# Patient Record
Sex: Male | Born: 1965 | Race: White | Hispanic: No | Marital: Married | Smoking: Never smoker
Health system: Southern US, Community
[De-identification: ages and names within clinical notes are randomized; demographics above are authoritative.]

## PROBLEM LIST (undated history)

## (undated) DIAGNOSIS — Z789 Other specified health status: Secondary | ICD-10-CM

## (undated) DIAGNOSIS — N200 Calculus of kidney: Secondary | ICD-10-CM

---

## 2000-11-28 ENCOUNTER — Ambulatory Visit (HOSPITAL_BASED_OUTPATIENT_CLINIC_OR_DEPARTMENT_OTHER): Admission: RE | Admit: 2000-11-28 | Discharge: 2000-11-28 | Payer: Self-pay | Admitting: Gastroenterology

## 2005-02-25 HISTORY — PX: KNEE ARTHROSCOPY: SUR90

## 2009-02-25 HISTORY — PX: UMBILICAL HERNIA REPAIR: SHX196

## 2010-12-27 DIAGNOSIS — N200 Calculus of kidney: Secondary | ICD-10-CM

## 2010-12-27 HISTORY — DX: Calculus of kidney: N20.0

## 2011-01-23 ENCOUNTER — Other Ambulatory Visit: Payer: Self-pay | Admitting: Family Medicine

## 2011-01-23 ENCOUNTER — Ambulatory Visit
Admission: RE | Admit: 2011-01-23 | Discharge: 2011-01-23 | Disposition: A | Discharge status: Home / Self Care | Payer: 59 | Source: Ambulatory Visit | Attending: Family Medicine | Admitting: Family Medicine

## 2011-01-23 DIAGNOSIS — R310 Gross hematuria: Secondary | ICD-10-CM

## 2011-02-15 ENCOUNTER — Other Ambulatory Visit: Payer: Self-pay | Admitting: Urology

## 2011-02-20 ENCOUNTER — Encounter (HOSPITAL_COMMUNITY): Payer: Self-pay | Admitting: Pharmacy Technician

## 2011-02-20 ENCOUNTER — Encounter (HOSPITAL_COMMUNITY): Payer: Self-pay | Admitting: *Deleted

## 2011-02-23 NOTE — H&P (Signed)
History of Present Illness  [name] presents today as a new patient with a recently diagnosed ureteral stone and several days of gross hematuria and nonspecific back pain.  [name] is 45 years of age.  We do care for his father.  [name] initially noticed asymptomatic gross hematuria several days ago.  He subsequently developed some right-sided and lower back discomfort.  Recent CT showed what appeared to be a 4 x 6 x 7 mm proximal right ureteral stone.  Some very tiny bilateral nonobstructing stones. He has intermittently continued to have some discomfort.  I have reviewed his CT and concur with those findings.     Past Medical History Problems  1. History of  No Medical Problems  Surgical History Problems  1. History of  Knee Surgery 2. History of  Umbilical Hernia Repair  Current Meds 1. Oxycodone-Acetaminophen 5-325 MG Oral Tablet; TAKE 1 TABLET Every  4 hours PRN pain;  Therapy: 30Nov2012 to (Last Rx:30Nov2012)  Allergies Medication  1. No Known Drug Allergies  Family History Problems  1. Maternal history of  Breast Cancer V16.3 2. Maternal history of  Death In The Family Mother 63yrs, breast ca Denied  3. Family history of  Family Health Status Number Of Children  Social History Problems  1. Caffeine Use 2 qd 2. Marital History - Currently Married 3. Never A Smoker 4. 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 History of  Alcohol Use 6. History of  Tobacco Use  Review of Systems Genitourinary, constitutional, skin, eye, otolaryngeal, hematologic/lymphatic, cardiovascular, pulmonary, endocrine, musculoskeletal, gastrointestinal, neurological and psychiatric system(s) were reviewed and pertinent findings if present are noted.  Gastrointestinal: flank pain, but no nausea and no vomiting.    Vitals Vital Signs [Data Includes: Last 1 Day]  20Dec2012 01:19PM  Height: 5 ft 11 in Blood Pressure: 109 / 71 Temperature: 97.6 F Heart Rate: 62  Physical  Exam Constitutional: Well nourished and well developed . No acute distress.  ENT:. The ears and nose are normal in appearance.  Neck: The appearance of the neck is normal and no neck mass is present.  Pulmonary: No respiratory distress and normal respiratory rhythm and effort.  Cardiovascular: Heart rate and rhythm are normal . No peripheral edema.  Abdomen: The abdomen is soft and nontender. No masses are palpated. No CVA tenderness. No hernias are palpable. No hepatosplenomegaly noted.  Genitourinary: Examination of the penis demonstrates no discharge, no masses, no lesions and a normal meatus. The scrotum is without lesions. The right epididymis is palpably normal and non-tender. The left epididymis is palpably normal and non-tender. The right testis is non-tender and without masses. The left testis is non-tender and without masses.  Skin: Normal skin turgor, no visible rash and no visible skin lesions.  Neuro/Psych:. Mood and affect are appropriate.    Results/Data Urine [Data Includes: Last 1 Day]   20Dec2012  COLOR YELLOW   APPEARANCE CLEAR   SPECIFIC GRAVITY <1.005   pH 7.0   GLUCOSE NEG mg/dL  BILIRUBIN NEG   KETONE NEG mg/dL  BLOOD MOD   PROTEIN NEG mg/dL  UROBILINOGEN 0.2 mg/dL  NITRITE NEG   LEUKOCYTE ESTERASE NEG   SQUAMOUS EPITHELIAL/HPF RARE   WBC 0-3 WBC/hpf  RBC 0-3 RBC/hpf  BACTERIA RARE   CRYSTALS NONE SEEN   CASTS NONE SEEN    Assessment Assessed  1. Nephrolithiasis 592.0 2. Ureteral Stone 592.1  Plan Health Maintenance (V70.0)  1. UA With REFLEX  Done: 20Dec2012 12:59PM Ureteral Stone (592.1)  2. Follow-up Schedule Surgery Office  Follow-up  Requested for: 20Dec2012  Discussion/Summary          At this point [place] still has a 6-7 mm proximal right ureteral stone. There has really been no clinical progression after 3 weeks. At this point I think his chances of spontaneously passing this are certainly decreased and it would be appropriate at this  point to go ahead and get him on the schedule for treatment. Given the current location of the stone and it size, lithotripsy would make the most sense. I explained that procedure to him, what is involved, typical success rates, risk factors, etc. We will try to get him on the schedule for sometime in the next few weeks to have this. Obviously, if he does get lucky and passes the stone, we would be delighted to cancel. If he gets any significant problems clinically before then with substantial increases of pain or other problems, then we will try to expedite his care if possible.       Signatures Electronically signed by : Wesley Carey, M.D.; Feb 14 2011  3:43PM

## 2011-02-25 ENCOUNTER — Ambulatory Visit (HOSPITAL_COMMUNITY)
Admission: RE | Admit: 2011-02-25 | Discharge: 2011-02-25 | Disposition: A | Discharge status: Home / Self Care | Payer: 59 | Source: Ambulatory Visit | Attending: Urology | Admitting: Urology

## 2011-02-25 ENCOUNTER — Ambulatory Visit (HOSPITAL_COMMUNITY): Payer: 59

## 2011-02-25 ENCOUNTER — Encounter (HOSPITAL_COMMUNITY): Payer: Self-pay | Admitting: *Deleted

## 2011-02-25 ENCOUNTER — Encounter (HOSPITAL_COMMUNITY)
Admission: RE | Disposition: A | Discharge status: Home / Self Care | Payer: Self-pay | Source: Ambulatory Visit | Attending: Urology

## 2011-02-25 DIAGNOSIS — N201 Calculus of ureter: Secondary | ICD-10-CM | POA: Insufficient documentation

## 2011-02-25 HISTORY — DX: Calculus of kidney: N20.0

## 2011-02-25 HISTORY — DX: Other specified health status: Z78.9

## 2011-02-25 LAB — BASIC METABOLIC PANEL
BUN: 15 mg/dL (ref 6–23)
CO2: 31 mEq/L (ref 19–32)
Calcium: 10 mg/dL (ref 8.4–10.5)
Chloride: 103 mEq/L (ref 96–112)
Creatinine, Ser: 0.83 mg/dL (ref 0.50–1.35)
GFR calc Af Amer: >90 mL/min (ref >90)
GFR calc non Af Amer: >90 mL/min (ref >90)
Glucose, Bld: 92 mg/dL (ref 70–99)
Potassium: 4.2 mEq/L (ref 3.5–5.1)
Sodium: 141 mEq/L (ref 135–145)

## 2011-02-25 SURGERY — LITHOTRIPSY, ESWL
Anesthesia: LOCAL | Laterality: Right

## 2011-02-25 MED ORDER — DIAZEPAM 5 MG PO TABS
ORAL_TABLET | ORAL | Status: AC
  Administered 2011-02-25: 10 mg via ORAL
  Filled 2011-02-25: qty 2

## 2011-02-25 MED ORDER — CIPROFLOXACIN HCL 500 MG PO TABS
500.0000 mg | ORAL_TABLET | ORAL | Status: AC
  Administered 2011-02-25: 500 mg via ORAL

## 2011-02-25 MED ORDER — DIPHENHYDRAMINE HCL 25 MG PO CAPS
ORAL_CAPSULE | ORAL | Status: AC
  Administered 2011-02-25: 25 mg via ORAL
  Filled 2011-02-25: qty 1

## 2011-02-25 MED ORDER — DIAZEPAM 5 MG PO TABS
10.0000 mg | ORAL_TABLET | ORAL | Status: AC
  Administered 2011-02-25: 10 mg via ORAL

## 2011-02-25 MED ORDER — DEXTROSE-NACL 5-0.45 % IV SOLN: INTRAVENOUS | Status: DC

## 2011-02-25 MED ORDER — DIPHENHYDRAMINE HCL 25 MG PO CAPS
25.0000 mg | ORAL_CAPSULE | ORAL | Status: AC
  Administered 2011-02-25: 25 mg via ORAL

## 2011-02-25 MED ORDER — CIPROFLOXACIN HCL 500 MG PO TABS
ORAL_TABLET | ORAL | Status: AC
  Administered 2011-02-25: 500 mg via ORAL
  Filled 2011-02-25: qty 1

## 2011-02-25 NOTE — Progress Notes (Signed)
Pt took laxative as ordered. Denies aspirin use past three days.

## 2011-02-25 NOTE — Op Note (Signed)
See [facility] OP note scanned into chart. 

## 2011-02-25 NOTE — Interval H&P Note (Signed)
History and Physical Interval Note:  02/25/2011 12:00 PM  Wesley Carey  has presented today for surgery, with the diagnosis of Right Ureteral Stone  The various methods of treatment have been discussed with the patient and family. After consideration of risks, benefits and other options for treatment, the patient has consented to  Procedure(s): EXTRACORPOREAL SHOCK WAVE LITHOTRIPSY (ESWL) as a surgical intervention .  The patients' history has been reviewed, patient examined, no change in status, stable for surgery.  I have reviewed the patients' chart and labs.  Questions were answered to the patient's satisfaction.     [name] S

## 2011-02-26 HISTORY — PX: LITHOTRIPSY: SUR834

## 2012-07-07 IMAGING — CT CT ABD-PELV W/O CM
2 of 4 series · 13 of 36 positions shown, 19 images · non-contrast
Comparison: None.

CLINICAL DATA: Painless gross hematuria for 5 days

CT ABDOMEN AND PELVIS WITHOUT CONTRAST
TECHNIQUE: Multidetector CT imaging of the abdomen and pelvis was
performed following the standard protocol without intravenous
contrast.

[Series 601: coronal body · coronal · 0.97mm/px · 1 of 118 slices shown, 2 images]
[im 40/118  soft-tissue]
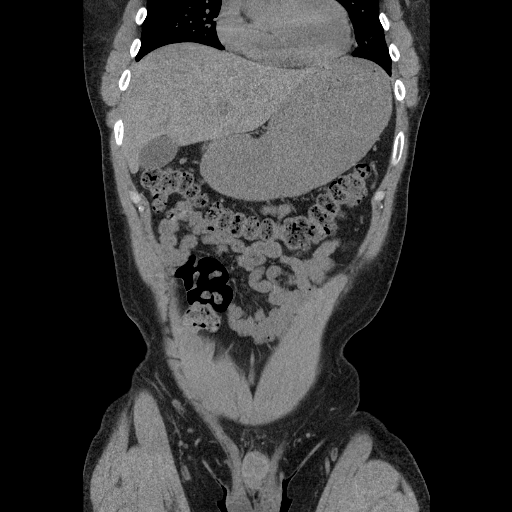
[im 40/118  bone]
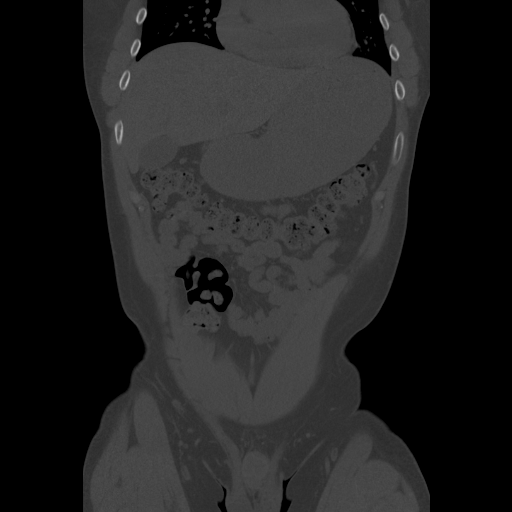

[Series 602: sagittal body · sagittal · 0.97mm/px · 12 of 141 slices shown, 17 images]
[im 9/141  soft-tissue]
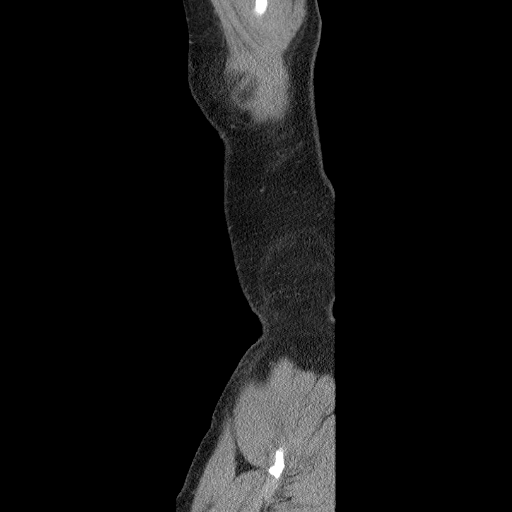
[im 9/141  lung]
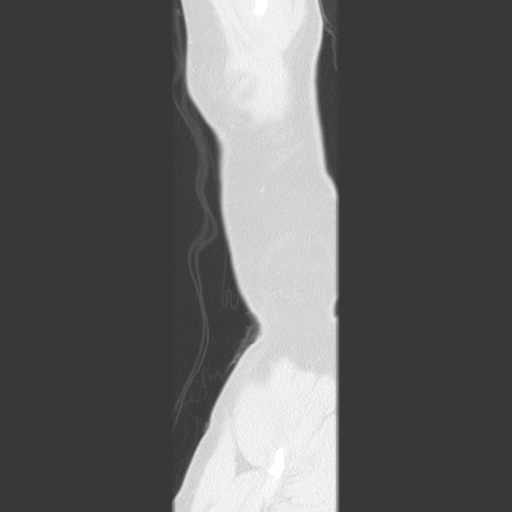
[im 9/141  bone]
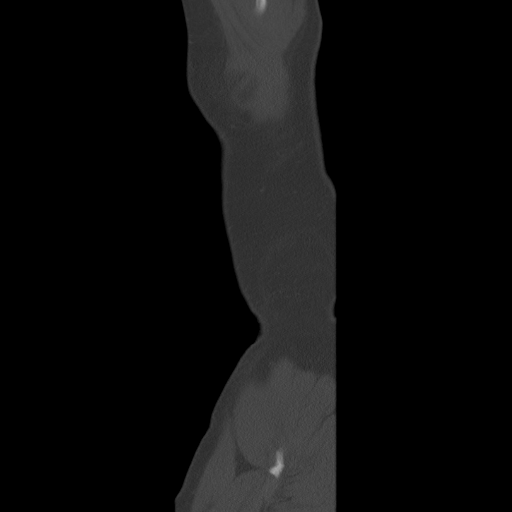
[im 18/141  lung]
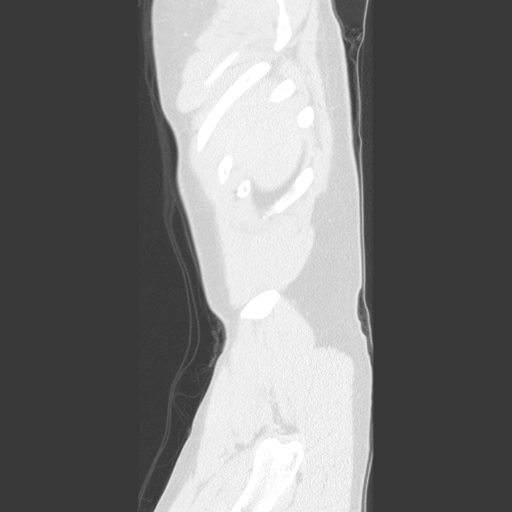
[im 27/141  soft-tissue]
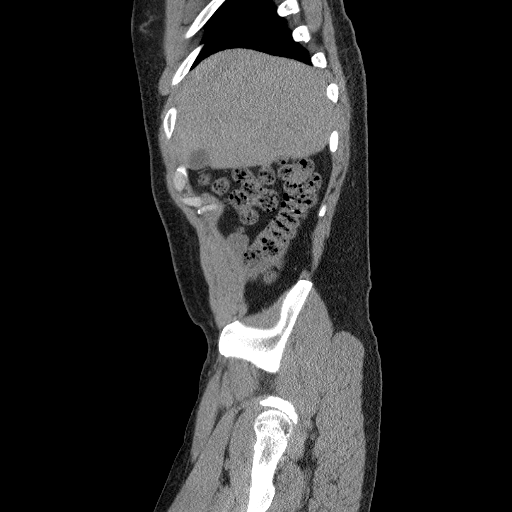
[im 27/141  lung]
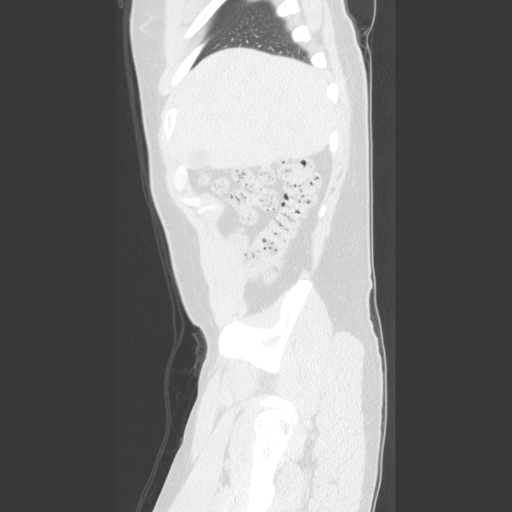
[im 36/141  soft-tissue]
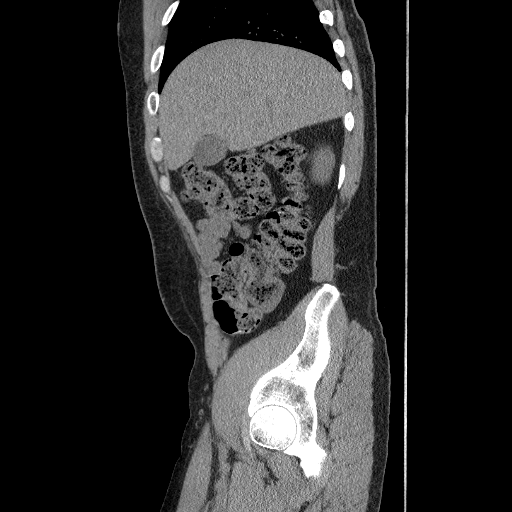
[im 36/141  lung]
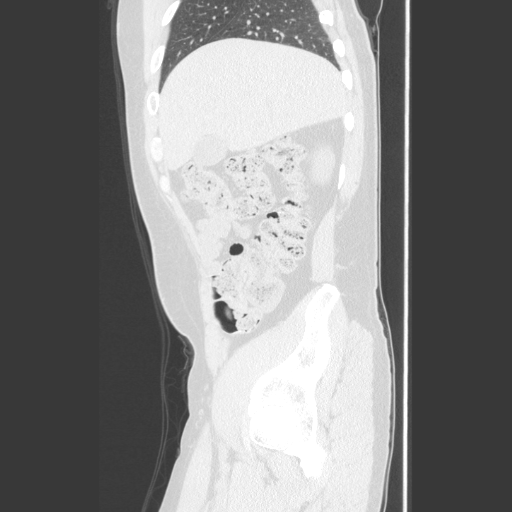
[im 44/141  soft-tissue]
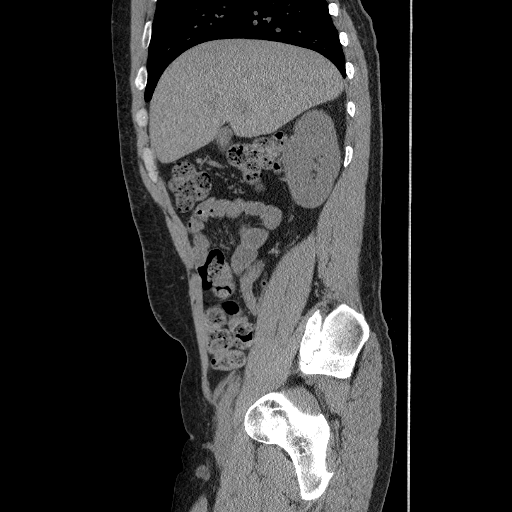
[im 62/141  soft-tissue]
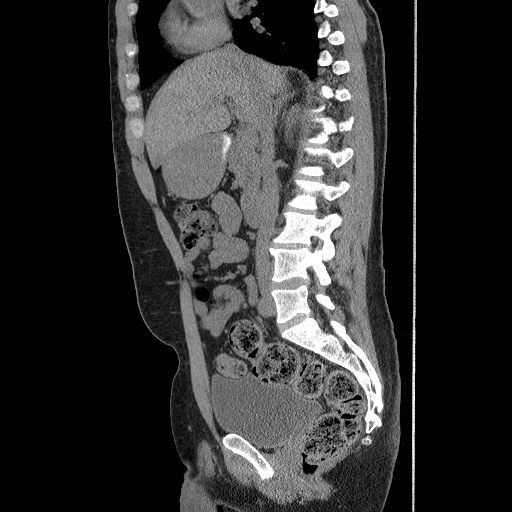
[im 71/141  soft-tissue]
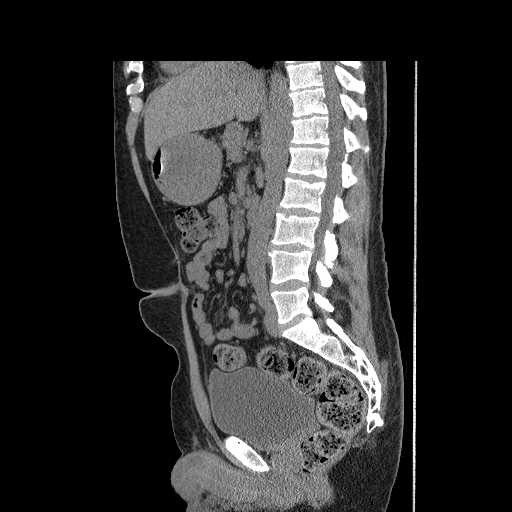
[im 79/141  soft-tissue]
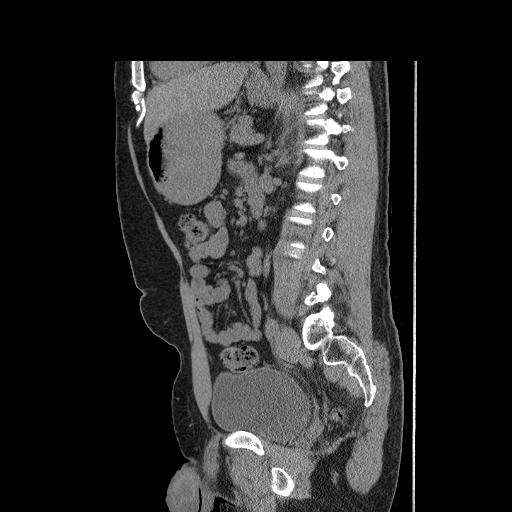
[im 97/141  soft-tissue]
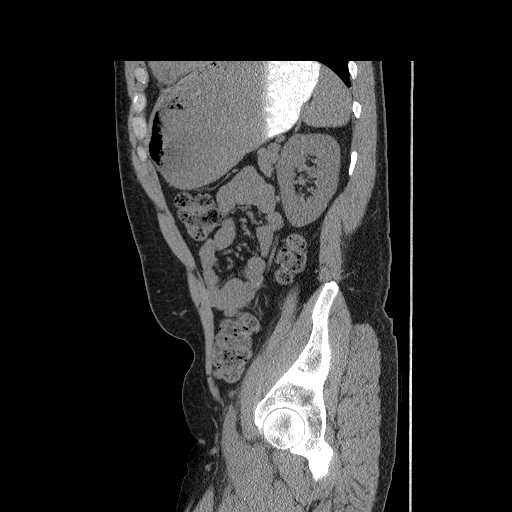
[im 106/141  soft-tissue]
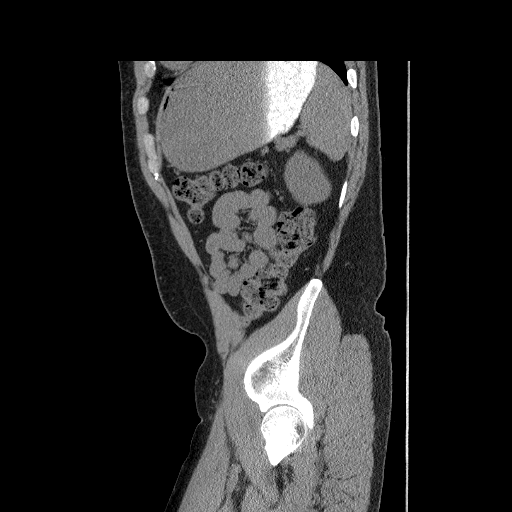
[im 106/141  bone]
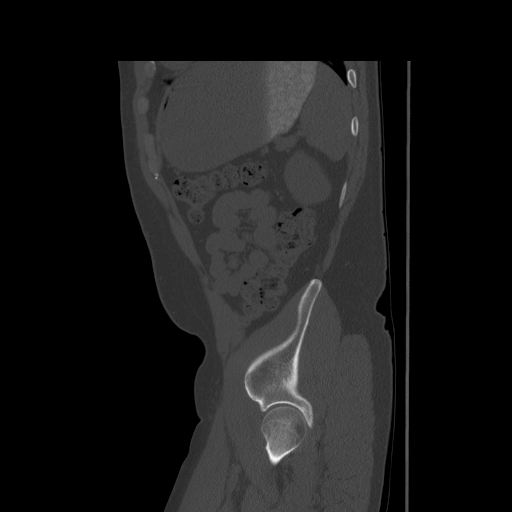
[im 114/141  soft-tissue]
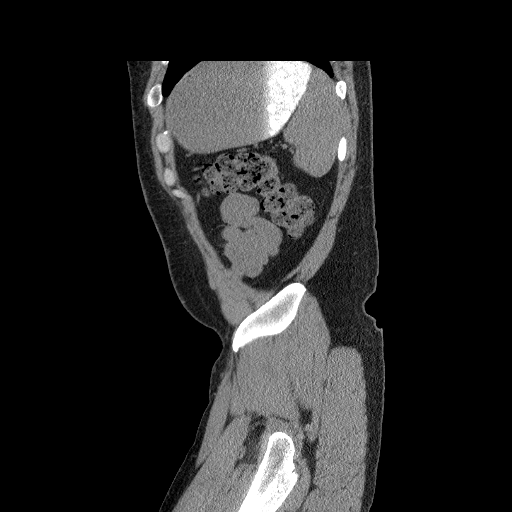
[im 132/141  soft-tissue]
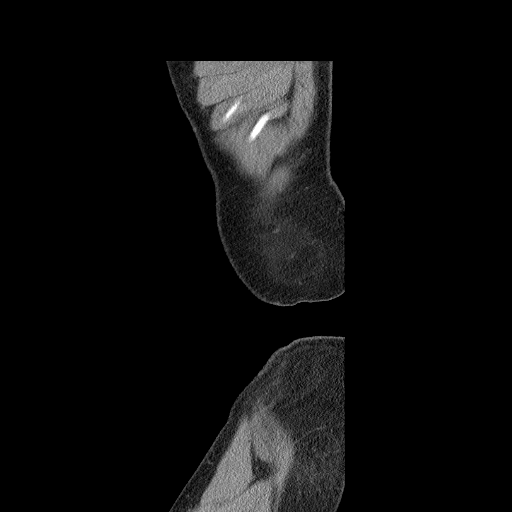

[13 of 36 positions shown; findings below may reference images not displayed]

FINDINGS: The lung bases are clear.  The liver is unremarkable in
the unenhanced state.  No calcified gallstones are seen.  The
pancreas is normal in size and the pancreatic duct is not dilated.
The adrenal glands and spleen are unremarkable.  The stomach is
moderately well distended with fluid and contrast and no
abnormality is seen.  Small nonobstructing bilateral renal calculi
are present.  However there is a calculus within the proximal right
ureter creating mild right hydronephrosis measuring 6 x 4 x 7 mm.
The abdominal aorta is normal in caliber.  No adenopathy is seen.

The distal ureters are normal in caliber and no distal ureteral
calculi are seen.  The urinary bladder is unremarkable.  The
prostate is within normal limits in size for age.  No pelvic mass
or fluid is seen.  There is feces throughout the colon.  The
terminal ileum and appendix are unremarkable.  No bony abnormality
is seen.
IMPRESSION: 1.  Mild hydronephrosis on the right caused by a 6 x 4 x 7 mm
proximal right ureteral calculus just below the right U P junction.
2.  Smaller nonobstructing renal calculi bilaterally.

## 2015-04-13 ENCOUNTER — Encounter: Payer: Self-pay | Admitting: Internal Medicine

## 2015-05-12 ENCOUNTER — Ambulatory Visit (AMBULATORY_SURGERY_CENTER): Payer: Self-pay | Admitting: *Deleted

## 2015-05-12 VITALS — Ht 66.0 in | Wt 191.6 lb

## 2015-05-12 DIAGNOSIS — Z1211 Encounter for screening for malignant neoplasm of colon: Secondary | ICD-10-CM

## 2015-05-12 MED ORDER — NA SULFATE-K SULFATE-MG SULF 17.5-3.13-1.6 GM/177ML PO SOLN: ORAL | Status: DC

## 2015-05-12 NOTE — Progress Notes (Signed)
No allergies to eggs or soy. No problems with anesthesia.  Pt given Emmi instructions for colonoscopy  No oxygen use  No diet drug use  

## 2015-05-19 ENCOUNTER — Encounter: Payer: Self-pay | Admitting: Internal Medicine

## 2015-05-19 ENCOUNTER — Ambulatory Visit (AMBULATORY_SURGERY_CENTER): Payer: 59 | Admitting: Internal Medicine

## 2015-05-19 VITALS — BP 110/67 | HR 52 | Temp 97.8°F | Resp 17 | Ht 66.0 in | Wt 191.0 lb

## 2015-05-19 DIAGNOSIS — D125 Benign neoplasm of sigmoid colon: Secondary | ICD-10-CM

## 2015-05-19 DIAGNOSIS — D122 Benign neoplasm of ascending colon: Secondary | ICD-10-CM

## 2015-05-19 DIAGNOSIS — D120 Benign neoplasm of cecum: Secondary | ICD-10-CM | POA: Diagnosis not present

## 2015-05-19 DIAGNOSIS — Z1211 Encounter for screening for malignant neoplasm of colon: Secondary | ICD-10-CM

## 2015-05-19 HISTORY — PX: COLONOSCOPY: SHX174

## 2015-05-19 MED ORDER — SODIUM CHLORIDE 0.9 % IV SOLN: 500.0000 mL | INTRAVENOUS | Status: DC

## 2015-05-19 NOTE — Op Note (Signed)
[place] Patient Name: Wesley Carey Procedure Date: 05/19/2015 7:20 AM MRN: [id number] Endoscopist: Jerene Bears , MD Age: 50 Referring MD:  Date of Birth: 12-Oct-1965 Gender: Male Procedure:                Colonoscopy Indications:              Screening for colorectal malignant neoplasm, This                            is the patient's first colonoscopy Medicines:                Monitored Anesthesia Care Procedure:                Pre-Anesthesia Assessment:                           - Prior to the procedure, a History and Physical                            was performed, and patient medications and                            allergies were reviewed. The patient's tolerance of                            previous anesthesia was also reviewed. The risks                            and benefits of the procedure and the sedation                            options and risks were discussed with the patient.                            All questions were answered, and informed consent                            was obtained. Prior Anticoagulants: The patient has                            taken no previous anticoagulant or antiplatelet                            agents. ASA Grade Assessment: II - A patient with                            mild systemic disease. After reviewing the risks                            and benefits, the patient was deemed in                            satisfactory condition to undergo the procedure.  After obtaining informed consent, the colonoscope                            was passed under direct vision. Throughout the                            procedure, the patient's blood pressure, pulse, and                            oxygen saturations were monitored continuously. The                            Model CF-HQ190L [id number]) scope was introduced                            through the anus and advanced to the the cecum,                         identified by appendiceal orifice and ileocecal                            valve. The colonoscopy was performed without                            difficulty. The patient tolerated the procedure                            well. The quality of the bowel preparation was                            good. The ileocecal valve, appendiceal orifice, and                            rectum were photographed. The bowel preparation                            used was SUPREP. Scope In: 8:06:29 AM Scope Out: 8:24:33 AM Scope Withdrawal Time: 0 hours 14 minutes 14 seconds  Total Procedure Duration: 0 hours 18 minutes 4 seconds  Findings:      A 5 mm polyp was found in the cecum. The polyp was sessile. The polyp       was removed with a cold snare. Resection and retrieval were complete.      Two sessile polyps were found in the ascending colon. The polyps were 5       to 6 mm in size. These polyps were removed with a cold snare. Resection       and retrieval were complete.      A 5 mm polyp was found in the sigmoid colon. The polyp was sessile. The       polyp was removed with a cold snare. Resection and retrieval were       complete.      The retroflexed view of the distal rectum and anal verge was normal and       showed no anal or rectal abnormalities. Complications:  No immediate complications. Estimated Blood Loss:     Estimated blood loss was minimal. Impression:               - One 5 mm polyp in the cecum, removed with a cold                            snare. Resected and retrieved.                           - Two 5 to 6 mm polyps in the ascending colon,                            removed with a cold snare. Resected and retrieved.                           - One 5 mm polyp in the sigmoid colon, removed with                            a cold snare. Resected and retrieved.                           - The distal rectum and anal verge are normal on                             retroflexion view. Recommendation:           - Patient has a contact number available for                            emergencies. The signs and symptoms of potential                            delayed complications were discussed with the                            patient. Return to normal activities tomorrow.                            Written discharge instructions were provided to the                            patient.                           - Resume previous diet.                           - Continue present medications.                           - Await pathology results.                           - Repeat colonoscopy for surveillance based on  pathology results. Procedure Code(s):        --- Professional ---                           [phone], Colonoscopy, flexible; with removal of                            tumor(s), polyp(s), or other lesion(s) by snare                            technique CPT copyright 2016 American [employer] Association. All rights reserved. Lajuan Lines. Hilarie Fredrickson, MD Jerene Bears, MD 05/19/2015 8:28:59 AM This report has been signed electronically. Number of Addenda: 0 Referring MD:

## 2015-05-19 NOTE — Progress Notes (Signed)
Report to PACU, RN, vss, BBS= Clear.  

## 2015-05-19 NOTE — Progress Notes (Signed)
Called to room to assist during endoscopic procedure.  Patient ID and intended procedure confirmed with present staff. Received instructions for my participation in the procedure from the performing physician.  

## 2015-05-19 NOTE — Patient Instructions (Signed)
YOU HAD AN ENDOSCOPIC PROCEDURE TODAY AT THE [facility]:   Refer to the procedure report that was given to you for any specific questions about what was found during the examination.  If the procedure report does not answer your questions, please call your gastroenterologist to clarify.  If you requested that your care partner not be given the details of your procedure findings, then the procedure report has been included in a sealed envelope for you to review at your convenience later.  YOU SHOULD EXPECT: Some feelings of bloating in the abdomen. Passage of more gas than usual.  Walking can help get rid of the air that was put into your GI tract during the procedure and reduce the bloating. If you had a lower endoscopy (such as a colonoscopy or flexible sigmoidoscopy) you may notice spotting of blood in your stool or on the toilet paper. If you underwent a bowel prep for your procedure, you may not have a normal bowel movement for a few days.  Please Note:  You might notice some irritation and congestion in your nose or some drainage.  This is from the oxygen used during your procedure.  There is no need for concern and it should clear up in a day or so.  SYMPTOMS TO REPORT IMMEDIATELY:   Following lower endoscopy (colonoscopy or flexible sigmoidoscopy):  Excessive amounts of blood in the stool  Significant tenderness or worsening of abdominal pains  Swelling of the abdomen that is new, acute  Fever of 100F or higher    For urgent or emergent issues, a gastroenterologist can be reached at any hour by calling [phone].   DIET: Your first meal following the procedure should be a small meal and then it is ok to progress to your normal diet. Heavy or fried foods are harder to digest and may make you feel nauseous or bloated.  Likewise, meals heavy in dairy and vegetables can increase bloating.  Drink plenty of fluids but you should avoid alcoholic beverages for 24  hours.  ACTIVITY:  You should plan to take it easy for the rest of today and you should NOT DRIVE or use heavy machinery until tomorrow (because of the sedation medicines used during the test).    FOLLOW UP: Our staff will call the number listed on your records the next business day following your procedure to check on you and address any questions or concerns that you may have regarding the information given to you following your procedure. If we do not reach you, we will leave a message.  However, if you are feeling well and you are not experiencing any problems, there is no need to return our call.  We will assume that you have returned to your regular daily activities without incident.  If any biopsies were taken you will be contacted by phone or by letter within the next 1-3 weeks.  Please call us at [phone] if you have not heard about the biopsies in 3 weeks.    SIGNATURES/CONFIDENTIALITY: You and/or your care partner have signed paperwork which will be entered into your electronic medical record.  These signatures attest to the fact that that the information above on your After Visit Summary has been reviewed and is understood.  Full responsibility of the confidentiality of this discharge information lies with you and/or your care-partner.   Information on polyps given to you today 

## 2015-05-22 ENCOUNTER — Telehealth: Payer: Self-pay | Admitting: *Deleted

## 2015-05-22 NOTE — Telephone Encounter (Signed)
  Follow up Call-  Call back number 05/19/2015  Post procedure Call Back phone  # [phone]  Permission to leave phone message Yes     Patient questions:  Do you have a fever, pain , or abdominal swelling? No. Pain Score  0 *  Have you tolerated food without any problems? Yes.    Have you been able to return to your normal activities? Yes.    Do you have any questions about your discharge instructions: Diet   No. Medications  No. Follow up visit  No.  Do you have questions or concerns about your Care? No.  Actions: * If pain score is 4 or above: No action needed, pain <4.

## 2015-05-23 ENCOUNTER — Encounter: Payer: Self-pay | Admitting: Internal Medicine

## 2018-04-07 ENCOUNTER — Encounter: Payer: Self-pay | Admitting: *Deleted

## 2018-05-10 ENCOUNTER — Encounter: Payer: Self-pay | Admitting: Internal Medicine

## 2018-07-31 ENCOUNTER — Ambulatory Visit (AMBULATORY_SURGERY_CENTER): Payer: Self-pay | Admitting: *Deleted

## 2018-07-31 ENCOUNTER — Other Ambulatory Visit: Payer: Self-pay

## 2018-07-31 VITALS — Temp 97.8°F | Ht 66.0 in | Wt 195.0 lb

## 2018-07-31 DIAGNOSIS — Z86010 Personal history of colonic polyps: Secondary | ICD-10-CM

## 2018-07-31 MED ORDER — NA SULFATE-K SULFATE-MG SULF 17.5-3.13-1.6 GM/177ML PO SOLN: ORAL | 0 refills | Status: DC

## 2018-07-31 NOTE — Progress Notes (Signed)
Patient is here w/mask on for PV. Patient denies any allergies to eggs or soy. Patient denies any problems with anesthesia/sedation. Patient denies any oxygen use at home. Patient denies taking any diet/weight loss medications or blood thinners. EMMI education assisgned to patient on colonoscopy, this was explained and instructions given to patient. Suprep coupon $15 given to pt.

## 2018-08-06 ENCOUNTER — Telehealth: Payer: Self-pay | Admitting: *Deleted

## 2018-08-06 NOTE — Telephone Encounter (Signed)
Covid-19 screening questions  Have you traveled in the last 14 days? no If yes where?  Do you now or have you had a fever in the last 14 days? no  Do you have any respiratory symptoms of shortness of breath or cough now or in the last 14 days? no  Do you have any family members or close contacts with diagnosed or suspected Covid-19 in the past 14 days? no  Have you been tested for Covid-19 and found to be positive? No  At this time, we are asking that care partners wait in the car during your procedure.  We ask that they do not leave.  We will get their cell phone numbers when you arrive and call them when you are reading to leave.  We will have them pull their car up by the front door and we will bring you out to them.        

## 2018-08-06 NOTE — Telephone Encounter (Signed)
Patient return call. ?

## 2018-08-06 NOTE — Telephone Encounter (Signed)
Patient return your call °

## 2018-08-06 NOTE — Telephone Encounter (Signed)
LM on VM for pt to call back for screening questions.

## 2018-08-07 ENCOUNTER — Ambulatory Visit (AMBULATORY_SURGERY_CENTER): Payer: PRIVATE HEALTH INSURANCE | Admitting: Internal Medicine

## 2018-08-07 ENCOUNTER — Encounter: Payer: Self-pay | Admitting: Internal Medicine

## 2018-08-07 ENCOUNTER — Other Ambulatory Visit: Payer: Self-pay

## 2018-08-07 VITALS — BP 108/65 | HR 52 | Temp 97.6°F | Resp 11 | Ht 66.0 in | Wt 195.0 lb

## 2018-08-07 DIAGNOSIS — Z86010 Personal history of colonic polyps: Secondary | ICD-10-CM | POA: Diagnosis present

## 2018-08-07 MED ORDER — SODIUM CHLORIDE 0.9 % IV SOLN: 500.0000 mL | Freq: Once | INTRAVENOUS | Status: DC

## 2018-08-07 NOTE — Progress Notes (Signed)
Pt's states no medical or surgical changes since previsit or office visit.  Vitals per JB Temp per [place]

## 2018-08-07 NOTE — Patient Instructions (Signed)
No polyps today!   YOU HAD AN ENDOSCOPIC PROCEDURE TODAY AT [facility]:   Refer to the procedure report that was given to you for any specific questions about what was found during the examination.  If the procedure report does not answer your questions, please call your gastroenterologist to clarify.  If you requested that your care partner not be given the details of your procedure findings, then the procedure report has been included in a sealed envelope for you to review at your convenience later.  YOU SHOULD EXPECT: Some feelings of bloating in the abdomen. Passage of more gas than usual.  Walking can help get rid of the air that was put into your GI tract during the procedure and reduce the bloating. If you had a lower endoscopy (such as a colonoscopy or flexible sigmoidoscopy) you may notice spotting of blood in your stool or on the toilet paper. If you underwent a bowel prep for your procedure, you may not have a normal bowel movement for a few days.  Please Note:  You might notice some irritation and congestion in your nose or some drainage.  This is from the oxygen used during your procedure.  There is no need for concern and it should clear up in a day or so.  SYMPTOMS TO REPORT IMMEDIATELY:   Following lower endoscopy (colonoscopy or flexible sigmoidoscopy):  Excessive amounts of blood in the stool  Significant tenderness or worsening of abdominal pains  Swelling of the abdomen that is new, acute  Fever of 100F or higher   For urgent or emergent issues, a gastroenterologist can be reached at any hour by calling [phone].   DIET:  We do recommend a small meal at first, but then you may proceed to your regular diet.  Drink plenty of fluids but you should avoid alcoholic beverages for 24 hours.  ACTIVITY:  You should plan to take it easy for the rest of today and you should NOT DRIVE or use heavy machinery until tomorrow (because of the sedation medicines  used during the test).    FOLLOW UP: Our staff will call the number listed on your records 48-72 hours following your procedure to check on you and address any questions or concerns that you may have regarding the information given to you following your procedure. If we do not reach you, we will leave a message.  We will attempt to reach you two times.  During this call, we will ask if you have developed any symptoms of COVID 19. If you develop any symptoms (ie: fever, flu-like symptoms, shortness of breath, cough etc.) before then, please call [phone].  If you test positive for Covid 19 in the 2 weeks post procedure, please call and report this information to [place].    If any biopsies were taken you will be contacted by phone or by letter within the next 1-3 weeks.  Please call us at [phone] if you have not heard about the biopsies in 3 weeks.    SIGNATURES/CONFIDENTIALITY: You and/or your care partner have signed paperwork which will be entered into your electronic medical record.  These signatures attest to the fact that that the information above on your After Visit Summary has been reviewed and is understood.  Full responsibility of the confidentiality of this discharge information lies with you and/or your care-partner.

## 2018-08-07 NOTE — Progress Notes (Signed)
To PACU, VSS. Report to [email or web address] 

## 2018-08-07 NOTE — Op Note (Signed)
[facility] Patient Name: Wesley Carey Procedure Date: 08/07/2018 2:04 PM MRN: [id number] Endoscopist: Jerene Bears , MD Age: 53 Referring MD:  Date of Birth: 1965-07-10 Gender: Male Account #: [id number] Procedure:                Colonoscopy Indications:              High risk colon cancer surveillance: Personal                            history of multiple (3 or more) adenomas, Last                            colonoscopy 3 years ago Medicines:                Monitored Anesthesia Care Procedure:                Pre-Anesthesia Assessment:                           - Prior to the procedure, a History and Physical                            was performed, and patient medications and                            allergies were reviewed. The patient's tolerance of                            previous anesthesia was also reviewed. The risks                            and benefits of the procedure and the sedation                            options and risks were discussed with the patient.                            All questions were answered, and informed consent                            was obtained. Prior Anticoagulants: The patient has                            taken no previous anticoagulant or antiplatelet                            agents. ASA Grade Assessment: II - A patient with                            mild systemic disease. After reviewing the risks                            and benefits, the patient was deemed in  satisfactory condition to undergo the procedure.                           After obtaining informed consent, the colonoscope                            was passed under direct vision. Throughout the                            procedure, the patient's blood pressure, pulse, and                            oxygen saturations were monitored continuously. The                            Model CF-HQ190L [id number]) scope was introduced                             through the anus and advanced to the cecum,                            identified by appendiceal orifice and ileocecal                            valve. The colonoscopy was performed without                            difficulty. The patient tolerated the procedure                            well. The quality of the bowel preparation was                            good. The ileocecal valve, appendiceal orifice, and                            rectum were photographed. Scope In: 2:18:52 PM Scope Out: 2:30:56 PM Scope Withdrawal Time: 0 hours 10 minutes 4 seconds  Total Procedure Duration: 0 hours 12 minutes 4 seconds  Findings:                 The digital rectal exam was normal.                           The entire examined colon appeared normal on direct                            and retroflexion views. Complications:            No immediate complications. Estimated Blood Loss:     Estimated blood loss: none. Impression:               - The entire examined colon is normal on direct and                            retroflexion  views.                           - No specimens collected. Recommendation:           - Patient has a contact number available for                            emergencies. The signs and symptoms of potential                            delayed complications were discussed with the                            patient. Return to normal activities tomorrow.                            Written discharge instructions were provided to the                            patient.                           - Resume previous diet.                           - Continue present medications.                           - Repeat colonoscopy in 5 years for surveillance. Jerene Bears, MD 08/07/2018 2:33:45 PM This report has been signed electronically.

## 2018-08-11 ENCOUNTER — Telehealth: Payer: Self-pay | Admitting: *Deleted

## 2018-08-11 ENCOUNTER — Telehealth: Payer: Self-pay

## 2018-08-11 NOTE — Telephone Encounter (Signed)
No answer, left message to call back later today, B.Ayeden Gladman RN. 

## 2018-08-11 NOTE — Telephone Encounter (Signed)
Second follow up call attempt.  Reached voicemail with phone number identified.  Message left to call if any questions or concerns regarding procedure or COVID 19

## 2022-10-22 ENCOUNTER — Ambulatory Visit
Admission: EM | Admit: 2022-10-22 | Discharge: 2022-10-22 | Disposition: A | Discharge status: Home / Self Care | Payer: No Typology Code available for payment source | Attending: Nurse Practitioner | Admitting: Nurse Practitioner

## 2022-10-22 DIAGNOSIS — Z1152 Encounter for screening for COVID-19: Secondary | ICD-10-CM | POA: Diagnosis present

## 2022-10-22 DIAGNOSIS — J069 Acute upper respiratory infection, unspecified: Secondary | ICD-10-CM

## 2022-10-22 MED ORDER — FLUTICASONE PROPIONATE 50 MCG/ACT NA SUSP: 2.0000 | Freq: Every day | NASAL | 0 refills | Status: DC

## 2022-10-22 NOTE — ED Triage Notes (Signed)
Pt states runny nose,congestion since yesterday.  States he took OTC cold medicine last night.

## 2022-10-22 NOTE — Discharge Instructions (Addendum)
COVID test is pending.  You will be contacted if the pending test result is positive.  You also have access to your result via MyChart. Take medication as prescribed. Increase fluids and allow for plenty of rest. May take over-the-counter Tylenol as needed for pain, fever, or general discomfort. Recommend normal saline nasal spray throughout the day to help with nasal congestion and runny nose. If your cough worsens, recommend using a humidifier in your bedroom at nighttime during sleep and sleeping elevated on pillows while cough symptoms persist. As discussed, if symptoms are not improving over the next 10 to 14 days, or if they are suddenly worsening, please follow-up in this clinic or with your primary care physician for further evaluation. Follow-up as needed.

## 2022-10-22 NOTE — ED Provider Notes (Signed)
[facility]    CSN: [id number] Arrival date & time: 10/22/22  1048      History   Chief Complaint Chief Complaint  Patient presents with   Nasal Congestion    HPI Wesley Carey is a 56 y.o. male.   The history is provided by the patient.   Patient presents for complaints of nasal congestion, runny nose, and mild cough that started over the past 24 hours.  Patient denies fever, chills, ear pain, headache, sore throat, wheezing, shortness of breath, difficulty breathing, chest pain, abdominal pain, nausea, vomiting, or diarrhea.  Patient reports he did have a coworker who was sick last week.  Patient reports he has taken over-the-counter NyQuil for his symptoms, which he feels has helped his symptoms some.  Past Medical History:  Diagnosis Date   Kidney stone 12/2010   first kidney   No pertinent past medical history     Patient Active Problem List   Diagnosis Date Noted   Ureteral calculus 02/25/2011    Past Surgical History:  Procedure Laterality Date   COLONOSCOPY  05/19/2015   KNEE ARTHROSCOPY Right 2007   and repair   LITHOTRIPSY  2013   kidney stone   UMBILICAL HERNIA REPAIR  2011       Home Medications    Prior to Admission medications   Medication Sig Start Date End Date Taking? Authorizing Provider  fluticasone (FLONASE) 50 MCG/ACT nasal spray Place 2 sprays into both nostrils daily. 10/22/22  Yes Kindal Ponti-Warren, Sadie Haber, NP  Multiple Vitamin (MULTIVITAMIN) tablet Take 1 tablet by mouth daily.    [provider]    Family History Family History  Problem Relation Age of Onset   Breast cancer Mother    Colon polyps Mother    Colon cancer Neg Hx    Esophageal cancer Neg Hx    Rectal cancer Neg Hx    Stomach cancer Neg Hx     Social History Social History   Tobacco Use   Smoking status: Never   Smokeless tobacco: Never  Vaping Use   Vaping status: Never Used  Substance Use Topics   Alcohol use: No    Alcohol/week:  0.0 standard drinks of alcohol   Drug use: No     Allergies   Vicodin [hydrocodone-acetaminophen]   Review of Systems Review of Systems Per HPI  Physical Exam Triage Vital Signs ED Triage Vitals [10/22/22 1139]  Encounter Vitals Group     BP (!) 151/88     Systolic BP Percentile      Diastolic BP Percentile      Pulse Rate 64     Resp 16     Temp 98.3 F (36.8 C)     Temp Source Oral     SpO2 96 %     Weight      Height      Head Circumference      Peak Flow      Pain Score 0     Pain Loc      Pain Education      Exclude from Growth Chart    No data found.  Updated Vital Signs BP (!) 151/88 (BP Location: Right Arm)   Pulse 64   Temp 98.3 F (36.8 C) (Oral)   Resp 16   SpO2 96%   Visual Acuity Right Eye Distance:   Left Eye Distance:   Bilateral Distance:    Right Eye Near:   Left Eye Near:  Bilateral Near:     Physical Exam Vitals and nursing note reviewed.  Constitutional:      General: He is not in acute distress.    Appearance: Normal appearance.  HENT:     Head: Normocephalic.     Right Ear: Tympanic membrane, ear canal and external ear normal.     Left Ear: Tympanic membrane, ear canal and external ear normal.     Nose: Congestion and rhinorrhea present.     Mouth/Throat:     Lips: Pink.     Mouth: Mucous membranes are moist.     Pharynx: Oropharynx is clear. Uvula midline. Posterior oropharyngeal erythema and postnasal drip present. No pharyngeal swelling, oropharyngeal exudate or uvula swelling.     Comments: Cobblestoning present to posterior oropharynx Eyes:     Extraocular Movements: Extraocular movements intact.     Conjunctiva/sclera: Conjunctivae normal.     Pupils: Pupils are equal, round, and reactive to light.  Cardiovascular:     Rate and Rhythm: Normal rate and regular rhythm.     Pulses: Normal pulses.     Heart sounds: Normal heart sounds.  Pulmonary:     Effort: Pulmonary effort is normal. No respiratory distress.      Breath sounds: Normal breath sounds. No stridor. No wheezing, rhonchi or rales.  Abdominal:     General: Bowel sounds are normal.     Palpations: Abdomen is soft.     Tenderness: There is no abdominal tenderness.  Musculoskeletal:     Cervical back: Normal range of motion.  Lymphadenopathy:     Cervical: No cervical adenopathy.  Skin:    General: Skin is warm and dry.  Neurological:     General: No focal deficit present.     Mental Status: He is alert and oriented to person, place, and time.  Psychiatric:        Mood and Affect: Mood normal.        Behavior: Behavior normal.      UC Treatments / Results  Labs (all labs ordered are listed, but only abnormal results are displayed) Labs Reviewed  SARS CORONAVIRUS 2 (TAT 6-24 HRS)    EKG   Radiology No results found.  Procedures Procedures (including critical care time)  Medications Ordered in UC Medications - No data to display  Initial Impression / Assessment and Plan / UC Course  I have reviewed the triage vital signs and the nursing notes.  Pertinent labs & imaging results that were available during my care of the patient were reviewed by me and considered in my medical decision making (see chart for details).  The patient is well-appearing, he is in no acute distress, vital signs are stable.  COVID test is pending.  Patient is able to receive Paxlovid if his COVID test is positive.  Will start patient on fluticasone 50 mcg nasal spray for nasal congestion and runny nose.  Patient reports good relief of symptoms with over-the-counter medication which she can continue.  Supportive care recommendations were provided and discussed with the patient to include increasing fluids, allowing for plenty of rest, normal saline nasal spray throughout the day, and over-the-counter analgesics for pain or discomfort.  Discussed with patient's indications of when follow-up may be necessary.  Patient is in agreement with this plan of  care and verbalizes understanding.  All questions were answered.  Patient stable for discharge.  Work note was provided.  Final Clinical Impressions(s) / UC Diagnoses   Final diagnoses:  Viral upper respiratory  tract infection with cough  Encounter for screening for COVID-19     Discharge Instructions      COVID test is pending.  You will be contacted if the pending test result is positive.  You also have access to your result via MyChart. Take medication as prescribed. Increase fluids and allow for plenty of rest. May take over-the-counter Tylenol as needed for pain, fever, or general discomfort. Recommend normal saline nasal spray throughout the day to help with nasal congestion and runny nose. If your cough worsens, recommend using a humidifier in your bedroom at nighttime during sleep and sleeping elevated on pillows while cough symptoms persist. As discussed, if symptoms are not improving over the next 10 to 14 days, or if they are suddenly worsening, please follow-up in this clinic or with your primary care physician for further evaluation. Follow-up as needed.    ED Prescriptions     Medication Sig Dispense Auth. Provider   fluticasone (FLONASE) 50 MCG/ACT nasal spray Place 2 sprays into both nostrils daily. 16 g Edge Mauger-Warren, Sadie Haber, NP      PDMP not reviewed this encounter.   Abran Cantor, NP 10/22/22 [phone]

## 2022-10-23 LAB — SARS CORONAVIRUS 2 (TAT 6-24 HRS): SARS Coronavirus 2: POSITIVE — AB

## 2022-11-19 ENCOUNTER — Encounter: Payer: Self-pay | Admitting: Family Medicine

## 2022-11-19 ENCOUNTER — Ambulatory Visit: Payer: No Typology Code available for payment source | Admitting: Family Medicine

## 2022-11-19 VITALS — BP 120/80 | Temp 98.3°F | Ht 66.0 in | Wt 195.0 lb

## 2022-11-19 DIAGNOSIS — E669 Obesity, unspecified: Secondary | ICD-10-CM

## 2022-11-19 DIAGNOSIS — Z114 Encounter for screening for human immunodeficiency virus [HIV]: Secondary | ICD-10-CM

## 2022-11-19 DIAGNOSIS — Z0001 Encounter for general adult medical examination with abnormal findings: Secondary | ICD-10-CM | POA: Diagnosis not present

## 2022-11-19 DIAGNOSIS — Z0000 Encounter for general adult medical examination without abnormal findings: Secondary | ICD-10-CM | POA: Insufficient documentation

## 2022-11-19 DIAGNOSIS — Z1159 Encounter for screening for other viral diseases: Secondary | ICD-10-CM

## 2022-11-19 DIAGNOSIS — Z6831 Body mass index (BMI) 31.0-31.9, adult: Secondary | ICD-10-CM | POA: Insufficient documentation

## 2022-11-19 DIAGNOSIS — Z125 Encounter for screening for malignant neoplasm of prostate: Secondary | ICD-10-CM

## 2022-11-19 NOTE — Patient Instructions (Signed)
It was great to meet you today and I'm excited to have you join the [facility] practice. I hope you had a positive experience today! If you feel so inclined, please feel free to recommend our practice to friends and family. Mila Merry, FNP-C

## 2022-11-19 NOTE — Assessment & Plan Note (Signed)

## 2022-11-19 NOTE — Progress Notes (Signed)
New Patient Office Visit  Subjective    Patient ID: Wesley Carey, male    DOB: 1965-10-19  Age: 57 y.o. MRN: [id number]  CC:  Chief Complaint  Patient presents with   Establish Care    HPI [name] presents to establish care. Oriented to practice routines and expectations. Has been receiving routine medical care. PMH includes kidney stones. No current concerns.  Colon CA screening: colonoscopy 4 years ago without abnormalities. Repeat 2025. Prostate CA screening: Agrees to PSA testing Tobacco: non-smoker Vaccines:  UTD    Outpatient Encounter Medications as of 11/19/2022  Medication Sig   [DISCONTINUED] fluticasone (FLONASE) 50 MCG/ACT nasal spray Place 2 sprays into both nostrils daily. (Patient not taking: Reported on 11/19/2022)   [DISCONTINUED] Multiple Vitamin (MULTIVITAMIN) tablet Take 1 tablet by mouth daily. (Patient not taking: Reported on 11/19/2022)   No facility-administered encounter medications on file as of 11/19/2022.    Past Medical History:  Diagnosis Date   Kidney stone 12/2010   first kidney   No pertinent past medical history     Past Surgical History:  Procedure Laterality Date   COLONOSCOPY  05/19/2015   KNEE ARTHROSCOPY Right 2007   and repair   LITHOTRIPSY  2013   kidney stone   UMBILICAL HERNIA REPAIR  2011    Family History  Problem Relation Age of Onset   Breast cancer Mother    Colon polyps Mother    Colon cancer Neg Hx    Esophageal cancer Neg Hx    Rectal cancer Neg Hx    Stomach cancer Neg Hx     Social History   Socioeconomic History   Marital status: Married    Spouse name: Not on file   Number of children: Not on file   Years of education: Not on file   Highest education level: Not on file  Occupational History   Not on file  Tobacco Use   Smoking status: Never   Smokeless tobacco: Never  Vaping Use   Vaping status: Never Used  Substance and Sexual Activity   Alcohol use: No    Alcohol/week: 0.0 standard  drinks of alcohol   Drug use: No   Sexual activity: Not on file  Other Topics Concern   Not on file  Social History Narrative   Not on file   Social Determinants of Health   Financial Resource Strain: Not on file  Food Insecurity: Not on file  Transportation Needs: Not on file  Physical Activity: Not on file  Stress: Not on file  Social Connections: Not on file  Intimate Partner Violence: Not on file    Review of Systems  Constitutional: Negative.   HENT: Negative.    Eyes: Negative.   Respiratory: Negative.    Cardiovascular: Negative.   Gastrointestinal: Negative.   Genitourinary: Negative.   Musculoskeletal: Negative.   Skin: Negative.   Neurological: Negative.   Endo/Heme/Allergies: Negative.   Psychiatric/Behavioral: Negative.    All other systems reviewed and are negative.       Objective    BP 120/80   Temp 98.3 F (36.8 C) (Oral)   Ht 5\' 6"  (1.676 m)   Wt 195 lb (88.5 kg)   SpO2 95%   BMI 31.47 kg/m   Physical Exam Vitals and nursing note reviewed.  Constitutional:      Appearance: Normal appearance. He is normal weight.  HENT:     Head: Normocephalic and atraumatic.     Right Ear: Tympanic  membrane, ear canal and external ear normal.     Left Ear: Tympanic membrane, ear canal and external ear normal.     Nose: Nose normal.     Mouth/Throat:     Mouth: Mucous membranes are moist.     Pharynx: Oropharynx is clear.  Eyes:     Extraocular Movements: Extraocular movements intact.     Right eye: Normal extraocular motion and no nystagmus.     Left eye: Normal extraocular motion and no nystagmus.     Conjunctiva/sclera: Conjunctivae normal.     Pupils: Pupils are equal, round, and reactive to light.  Cardiovascular:     Rate and Rhythm: Normal rate and regular rhythm.     Pulses: Normal pulses.     Heart sounds: Normal heart sounds.  Pulmonary:     Effort: Pulmonary effort is normal.     Breath sounds: Normal breath sounds.  Abdominal:      General: Bowel sounds are normal.     Palpations: Abdomen is soft.  Genitourinary:    Comments: Deferred using shared decision making Musculoskeletal:        General: Normal range of motion.     Cervical back: Normal range of motion and neck supple.  Skin:    General: Skin is warm and dry.     Capillary Refill: Capillary refill takes less than 2 seconds.  Neurological:     General: No focal deficit present.     Mental Status: He is alert. Mental status is at baseline.  Psychiatric:        Mood and Affect: Mood normal.        Speech: Speech normal.        Behavior: Behavior normal.        Thought Content: Thought content normal.        Cognition and Memory: Cognition and memory normal.        Judgment: Judgment normal.         Assessment & Plan:   Problem List Items Addressed This Visit     Physical exam, annual - Primary    Today your medical history was reviewed and routine physical exam with labs was performed. Recommend 150 minutes of moderate intensity exercise weekly and consuming a well-balanced diet. Advised to stop smoking if a smoker, avoid smoking if a non-smoker, limit alcohol consumption to 1 drink per day for women and 2 drinks per day for men, and avoid illicit drug use. Counseled in mental health awareness and when to seek medical care. Vaccine maintenance discussed. Appropriate health maintenance items reviewed. Return to office in 1 year for annual physical exam.       Relevant Orders   CBC with Differential/Platelet   COMPLETE METABOLIC PANEL WITH GFR   Lipid panel   Class 1 obesity without serious comorbidity with body mass index (BMI) of 31.0 to 31.9 in adult    Encourage low calorie, heart healthy diet and moderate intensity exercise 150 minutes weekly. This is 3-5 times weekly for 30-50 minutes each session. Goal should be pace of 3 miles/hours, or walking 1.5 miles in 30 minutes and include strength training.      Relevant Orders   Lipid panel    Other Visit Diagnoses     Prostate cancer screening       Relevant Orders   PSA   Screening for HIV (human immunodeficiency virus)       Relevant Orders   HIV Antibody (routine testing w rflx)  Need for hepatitis C screening test       Relevant Orders   Hepatitis C antibody       Return in about 1 year (around 11/19/2023) for ASAP labs and annual physical with labs 1 week prior.   Park Meo, FNP

## 2022-11-19 NOTE — Assessment & Plan Note (Signed)
Encourage low calorie, heart healthy diet and moderate intensity exercise 150 minutes weekly. This is 3-5 times weekly for 30-50 minutes each session. Goal should be pace of 3 miles/hours, or walking 1.5 miles in 30 minutes and include strength training.

## 2022-11-28 ENCOUNTER — Other Ambulatory Visit: Payer: No Typology Code available for payment source

## 2022-11-28 DIAGNOSIS — Z125 Encounter for screening for malignant neoplasm of prostate: Secondary | ICD-10-CM

## 2022-11-28 DIAGNOSIS — Z6831 Body mass index (BMI) 31.0-31.9, adult: Secondary | ICD-10-CM

## 2022-11-28 DIAGNOSIS — Z1159 Encounter for screening for other viral diseases: Secondary | ICD-10-CM

## 2022-11-28 DIAGNOSIS — Z114 Encounter for screening for human immunodeficiency virus [HIV]: Secondary | ICD-10-CM

## 2022-11-28 DIAGNOSIS — Z0000 Encounter for general adult medical examination without abnormal findings: Secondary | ICD-10-CM

## 2022-12-04 LAB — CBC WITH DIFFERENTIAL/PLATELET
Absolute Monocytes: 419 {cells}/uL (ref 200–950)
Basophils Absolute: 50 {cells}/uL (ref 0–200)
Basophils Relative: 1.1 %
Eosinophils Absolute: 171 {cells}/uL (ref 15–500)
Eosinophils Relative: 3.8 %
HCT: 47.8 % (ref 38.5–50.0)
Hemoglobin: 15.6 g/dL (ref 13.2–17.1)
Lymphs Abs: 1373 {cells}/uL (ref 850–3900)
MCH: 29.4 pg (ref 27.0–33.0)
MCHC: 32.6 g/dL (ref 32.0–36.0)
MCV: 90 fL (ref 80.0–100.0)
MPV: 9.8 fL (ref 7.5–12.5)
Monocytes Relative: 9.3 %
Neutro Abs: 2489 {cells}/uL (ref 1500–7800)
Neutrophils Relative %: 55.3 %
Platelets: 185 10*3/uL (ref 140–400)
RBC: 5.31 10*6/uL (ref 4.20–5.80)
RDW: 12.8 % (ref 11.0–15.0)
Total Lymphocyte: 30.5 %
WBC: 4.5 10*3/uL (ref 3.8–10.8)

## 2022-12-04 LAB — COMPLETE METABOLIC PANEL WITH GFR
AG Ratio: 1.7 (calc) (ref 1.0–2.5)
ALT: 28 U/L (ref 9–46)
AST: 21 U/L (ref 10–35)
Albumin: 4.6 g/dL (ref 3.6–5.1)
Alkaline phosphatase (APISO): 96 U/L (ref 35–144)
BUN: 16 mg/dL (ref 7–25)
CO2: 25 mmol/L (ref 20–32)
Calcium: 9.4 mg/dL (ref 8.6–10.3)
Chloride: 104 mmol/L (ref 98–110)
Creat: 0.86 mg/dL (ref 0.70–1.30)
Globulin: 2.7 g/dL (ref 1.9–3.7)
Glucose, Bld: 109 mg/dL — ABNORMAL HIGH (ref 65–99)
Potassium: 4.3 mmol/L (ref 3.5–5.3)
Sodium: 139 mmol/L (ref 135–146)
Total Bilirubin: 0.5 mg/dL (ref 0.2–1.2)
Total Protein: 7.3 g/dL (ref 6.1–8.1)
eGFR: 101 mL/min/{1.73_m2} (ref >=60)

## 2022-12-04 LAB — PSA: PSA: 0.5 ng/mL (ref <=4.00)

## 2022-12-04 LAB — TEST AUTHORIZATION

## 2022-12-04 LAB — LIPID PANEL
Cholesterol: 181 mg/dL (ref <200)
HDL: 51 mg/dL (ref >=40)
LDL Cholesterol (Calc): 113 mg/dL — ABNORMAL HIGH
Non-HDL Cholesterol (Calc): 130 mg/dL — ABNORMAL HIGH (ref <130)
Total CHOL/HDL Ratio: 3.5 (calc) (ref <5.0)
Triglycerides: 78 mg/dL (ref <150)

## 2022-12-04 LAB — HEPATITIS C ANTIBODY: Hepatitis C Ab: NONREACTIVE

## 2022-12-04 LAB — HEMOGLOBIN A1C
Hgb A1c MFr Bld: 5.6 %{Hb} (ref <5.7)
Mean Plasma Glucose: 114 mg/dL
eAG (mmol/L): 6.3 mmol/L

## 2022-12-04 LAB — HIV ANTIBODY (ROUTINE TESTING W REFLEX): HIV 1&2 Ab, 4th Generation: NONREACTIVE

## 2022-12-30 ENCOUNTER — Ambulatory Visit
Admission: EM | Admit: 2022-12-30 | Discharge: 2022-12-30 | Disposition: A | Discharge status: Home / Self Care | Payer: No Typology Code available for payment source | Attending: Nurse Practitioner | Admitting: Nurse Practitioner

## 2022-12-30 DIAGNOSIS — J069 Acute upper respiratory infection, unspecified: Secondary | ICD-10-CM

## 2022-12-30 MED ORDER — PROMETHAZINE-DM 6.25-15 MG/5ML PO SYRP
5.0000 mL | ORAL_SOLUTION | Freq: Every evening | ORAL | 0 refills | Status: AC | PRN
Start: 2022-12-30 — End: ?

## 2022-12-30 MED ORDER — BENZONATATE 100 MG PO CAPS
100.0000 mg | ORAL_CAPSULE | Freq: Three times a day (TID) | ORAL | 0 refills | Status: AC | PRN
Start: 2022-12-30 — End: ?

## 2022-12-30 NOTE — ED Triage Notes (Signed)
Cough, congestion x 5 days. Took home covid test that was negative.

## 2022-12-30 NOTE — ED Provider Notes (Signed)
[facility]    CSN: [id number] Arrival date & time: 12/30/22  1244      History   Chief Complaint Chief Complaint  Patient presents with   Cough    HPI Wesley Carey is a 57 y.o. male.   Patient presents today with a 5-day history of congested cough, runny and stuffy nose, sore throat, headache, decreased appetite.  No fevers, body aches or chills, shortness of breath or chest pain, ear pain, abdominal pain, nausea/vomiting, or diarrhea.  Reports wife developed symptoms shortly after he did.  Has not take anything over-the-counter so far for symptoms.    Past Medical History:  Diagnosis Date   Kidney stone 12/2010   first kidney   No pertinent past medical history     Patient Active Problem List   Diagnosis Date Noted   Physical exam, annual 11/19/2022   Class 1 obesity without serious comorbidity with body mass index (BMI) of 31.0 to 31.9 in adult 11/19/2022   Ureteral calculus 02/25/2011    Past Surgical History:  Procedure Laterality Date   COLONOSCOPY  05/19/2015   KNEE ARTHROSCOPY Right 2007   and repair   LITHOTRIPSY  2013   kidney stone   UMBILICAL HERNIA REPAIR  2011       Home Medications    Prior to Admission medications   Medication Sig Start Date End Date Taking? Authorizing Provider  benzonatate (TESSALON) 100 MG capsule Take 1 capsule (100 mg total) by mouth 3 (three) times daily as needed for cough. Do not take with alcohol or while driving or operating heavy machinery.  May cause drowsiness. 12/30/22  Yes Valentino Nose, NP  promethazine-dextromethorphan (PROMETHAZINE-DM) 6.25-15 MG/5ML syrup Take 5 mLs by mouth at bedtime as needed for cough. Do not take with alcohol or while driving or operating heavy machinery.  May cause drowsiness. 12/30/22  Yes Valentino Nose, NP    Family History Family History  Problem Relation Age of Onset   Breast cancer Mother    Colon polyps Mother    Colon cancer Neg Hx    Esophageal  cancer Neg Hx    Rectal cancer Neg Hx    Stomach cancer Neg Hx     Social History Social History   Tobacco Use   Smoking status: Never   Smokeless tobacco: Never  Vaping Use   Vaping status: Never Used  Substance Use Topics   Alcohol use: No    Alcohol/week: 0.0 standard drinks of alcohol   Drug use: No     Allergies   Vicodin [hydrocodone-acetaminophen]   Review of Systems Review of Systems Per HPI  Physical Exam Triage Vital Signs ED Triage Vitals  Encounter Vitals Group     BP 12/30/22 1402 (!) 138/97     Systolic BP Percentile --      Diastolic BP Percentile --      Pulse Rate 12/30/22 1402 62     Resp 12/30/22 1402 18     Temp 12/30/22 1402 98 F (36.7 C)     Temp Source 12/30/22 1402 Oral     SpO2 12/30/22 1402 94 %     Weight --      Height --      Head Circumference --      Peak Flow --      Pain Score 12/30/22 1403 0     Pain Loc --      Pain Education --      Exclude from  Growth Chart --    No data found.  Updated Vital Signs BP (!) 138/97 (BP Location: Right Arm)   Pulse 62   Temp 98 F (36.7 C) (Oral)   Resp 18   SpO2 94%   Visual Acuity Right Eye Distance:   Left Eye Distance:   Bilateral Distance:    Right Eye Near:   Left Eye Near:    Bilateral Near:     Physical Exam Vitals and nursing note reviewed.  Constitutional:      General: He is not in acute distress.    Appearance: Normal appearance. He is not ill-appearing or toxic-appearing.  HENT:     Head: Normocephalic and atraumatic.     Right Ear: Tympanic membrane, ear canal and external ear normal.     Left Ear: Tympanic membrane, ear canal and external ear normal.     Nose: No congestion or rhinorrhea.     Mouth/Throat:     Mouth: Mucous membranes are moist.     Pharynx: Oropharynx is clear. No oropharyngeal exudate or posterior oropharyngeal erythema.  Eyes:     General: No scleral icterus.    Extraocular Movements: Extraocular movements intact.  Cardiovascular:      Rate and Rhythm: Normal rate and regular rhythm.  Pulmonary:     Effort: Pulmonary effort is normal. No respiratory distress.     Breath sounds: Normal breath sounds. No wheezing, rhonchi or rales.  Musculoskeletal:     Cervical back: Normal range of motion and neck supple.  Lymphadenopathy:     Cervical: No cervical adenopathy.  Skin:    General: Skin is warm and dry.     Coloration: Skin is not jaundiced or pale.     Findings: No erythema or rash.  Neurological:     Mental Status: He is alert and oriented to person, place, and time.  Psychiatric:        Behavior: Behavior is cooperative.      UC Treatments / Results  Labs (all labs ordered are listed, but only abnormal results are displayed) Labs Reviewed - No data to display  EKG   Radiology No results found.  Procedures Procedures (including critical care time)  Medications Ordered in UC Medications - No data to display  Initial Impression / Assessment and Plan / UC Course  I have reviewed the triage vital signs and the nursing notes.  Pertinent labs & imaging results that were available during my care of the patient were reviewed by me and considered in my medical decision making (see chart for details).   Patient is well-appearing, normotensive, afebrile, not tachycardic, not tachypneic, oxygenating well on room air.    1. Viral URI with cough Suspect viral etiology; viral testing deferred given length of symptoms Vitals and exam are reassuring Supportive care discussed with patient, start cough suppressant medication and Mucinex Return and ER precautions discussed with patient  The patient was given the opportunity to ask questions.  All questions answered to their satisfaction.  The patient is in agreement to this plan.    Final Clinical Impressions(s) / UC Diagnoses   Final diagnoses:  Viral URI with cough     Discharge Instructions      You have a viral upper respiratory infection.  Symptoms  should improve over the next week to 10 days.  If you develop chest pain or shortness of breath, go to the emergency room.  Some things that can make you feel better are: - Increased rest -  Increasing fluid with water/sugar free electrolytes - Acetaminophen and ibuprofen as needed for fever/pain - Salt water gargling, chloraseptic spray and throat lozenges - OTC guaifenesin (Mucinex) 600 mg twice daily for congestion - Saline sinus flushes or a neti pot - Humidifying the air -Tessalon Perles every 8 hours as needed for dry cough and cough syrup at night time as needed for dry cough     ED Prescriptions     Medication Sig Dispense Auth. Provider   benzonatate (TESSALON) 100 MG capsule Take 1 capsule (100 mg total) by mouth 3 (three) times daily as needed for cough. Do not take with alcohol or while driving or operating heavy machinery.  May cause drowsiness. 21 capsule Cathlean Marseilles A, NP   promethazine-dextromethorphan (PROMETHAZINE-DM) 6.25-15 MG/5ML syrup Take 5 mLs by mouth at bedtime as needed for cough. Do not take with alcohol or while driving or operating heavy machinery.  May cause drowsiness. 118 mL Valentino Nose, NP      PDMP not reviewed this encounter.   Valentino Nose, NP 12/30/22 [phone]

## 2022-12-30 NOTE — Discharge Instructions (Addendum)
You have a viral upper respiratory infection.  Symptoms should improve over the next week to 10 days.  If you develop chest pain or shortness of breath, go to the emergency room.  Some things that can make you feel better are: - Increased rest - Increasing fluid with water/sugar free electrolytes - Acetaminophen and ibuprofen as needed for fever/pain - Salt water gargling, chloraseptic spray and throat lozenges - OTC guaifenesin (Mucinex) 600 mg twice daily for congestion - Saline sinus flushes or a neti pot - Humidifying the air -Tessalon Perles every 8 hours as needed for dry cough and cough syrup at night time as needed for dry cough

## 2024-01-24 ENCOUNTER — Encounter: Payer: Self-pay | Admitting: Internal Medicine
